# Patient Record
Sex: Female | Born: 1995 | Race: White | Hispanic: No | Marital: Single | Smoking: Never smoker
Health system: Southern US, Community
[De-identification: ages and names within clinical notes are randomized; demographics above are authoritative.]

---
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
## 2017-01-18 ENCOUNTER — Emergency Department (HOSPITAL_COMMUNITY)
Admission: EM | Admit: 2017-01-18 | Discharge: 2017-01-18 | Discharge status: Absent without leave | Payer: BLUE CROSS/BLUE SHIELD

## 2017-03-24 IMAGING — CR DG CHEST 2V
2 series · 2 of 2 positions shown · non-contrast
Comparison: None.

CLINICAL DATA: Status post fall

EXAM:
CHEST  2 VIEW

[w chest pa]
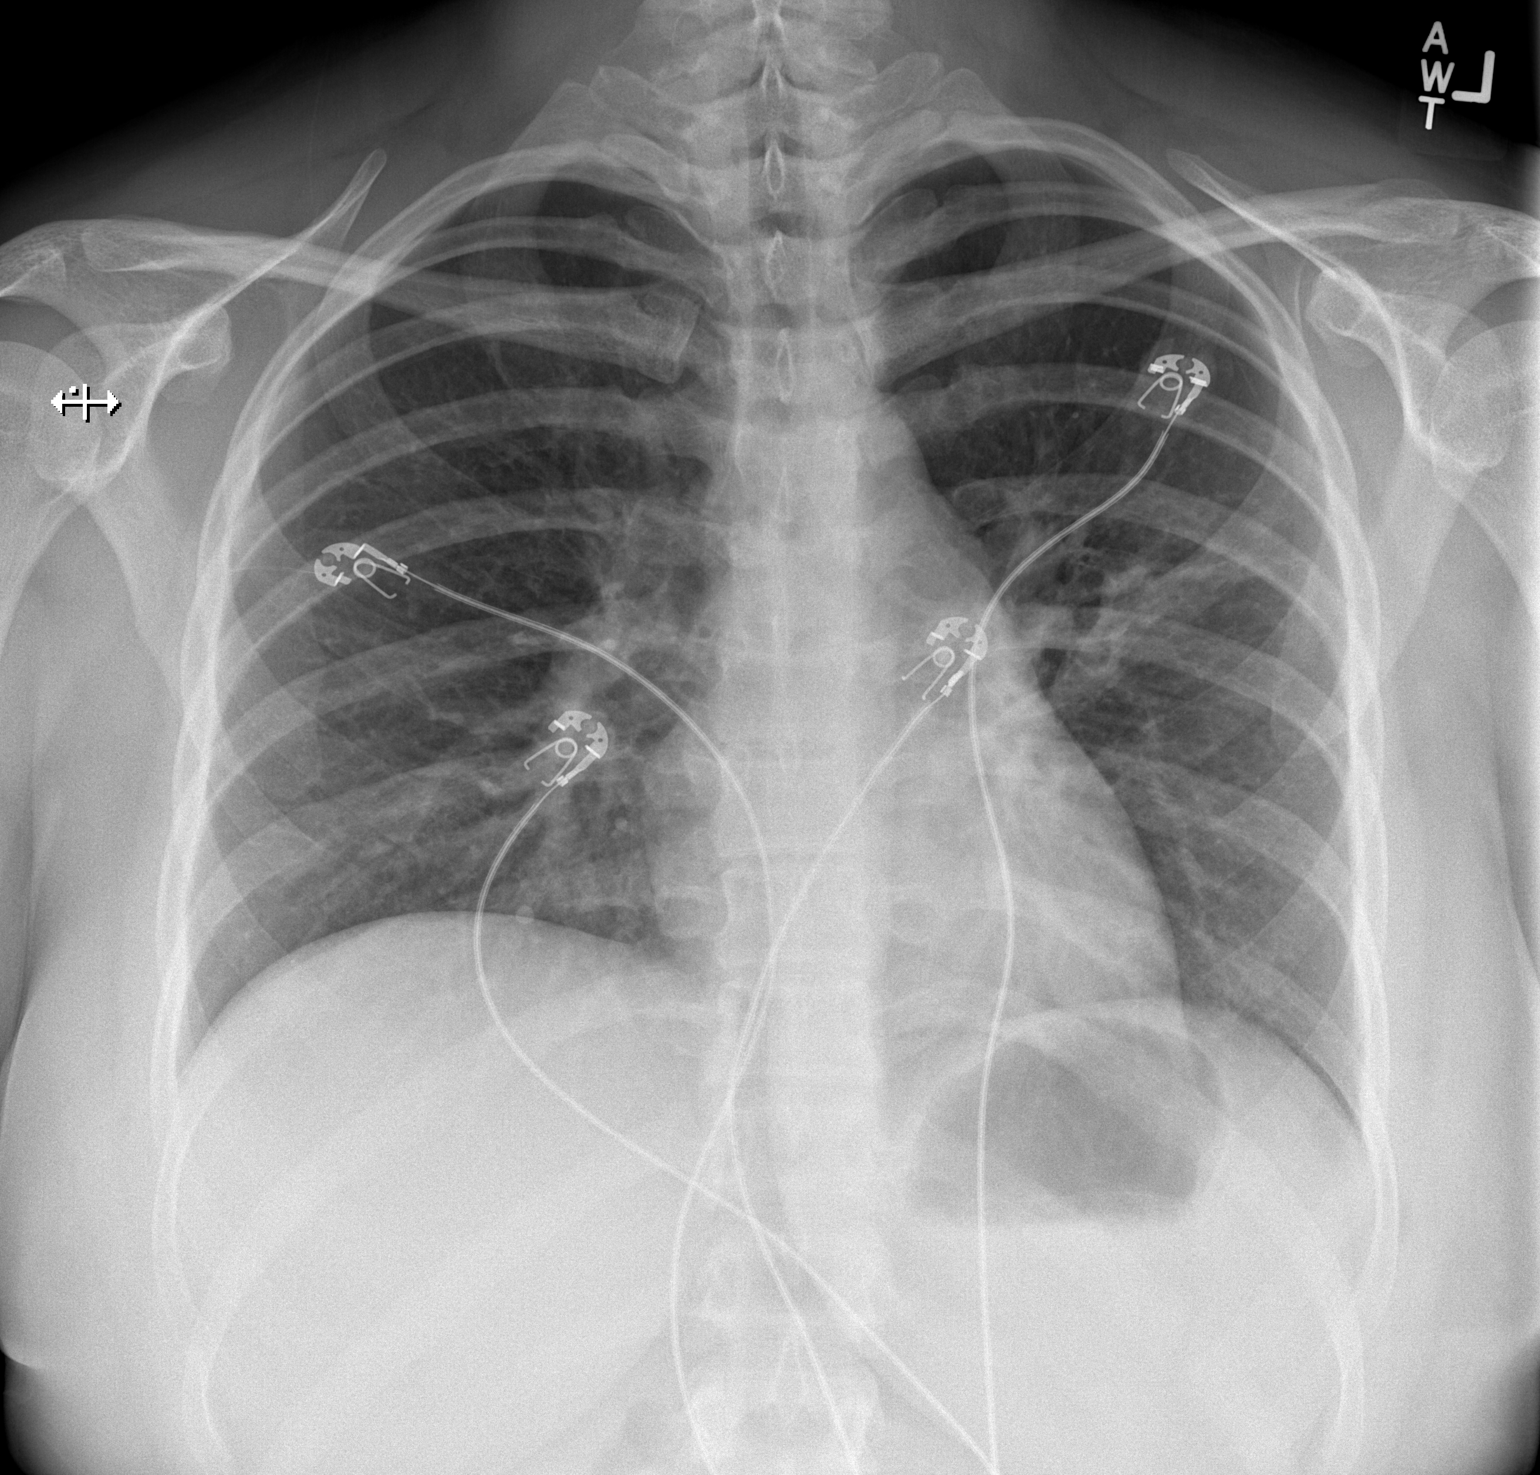

[w chest lat]
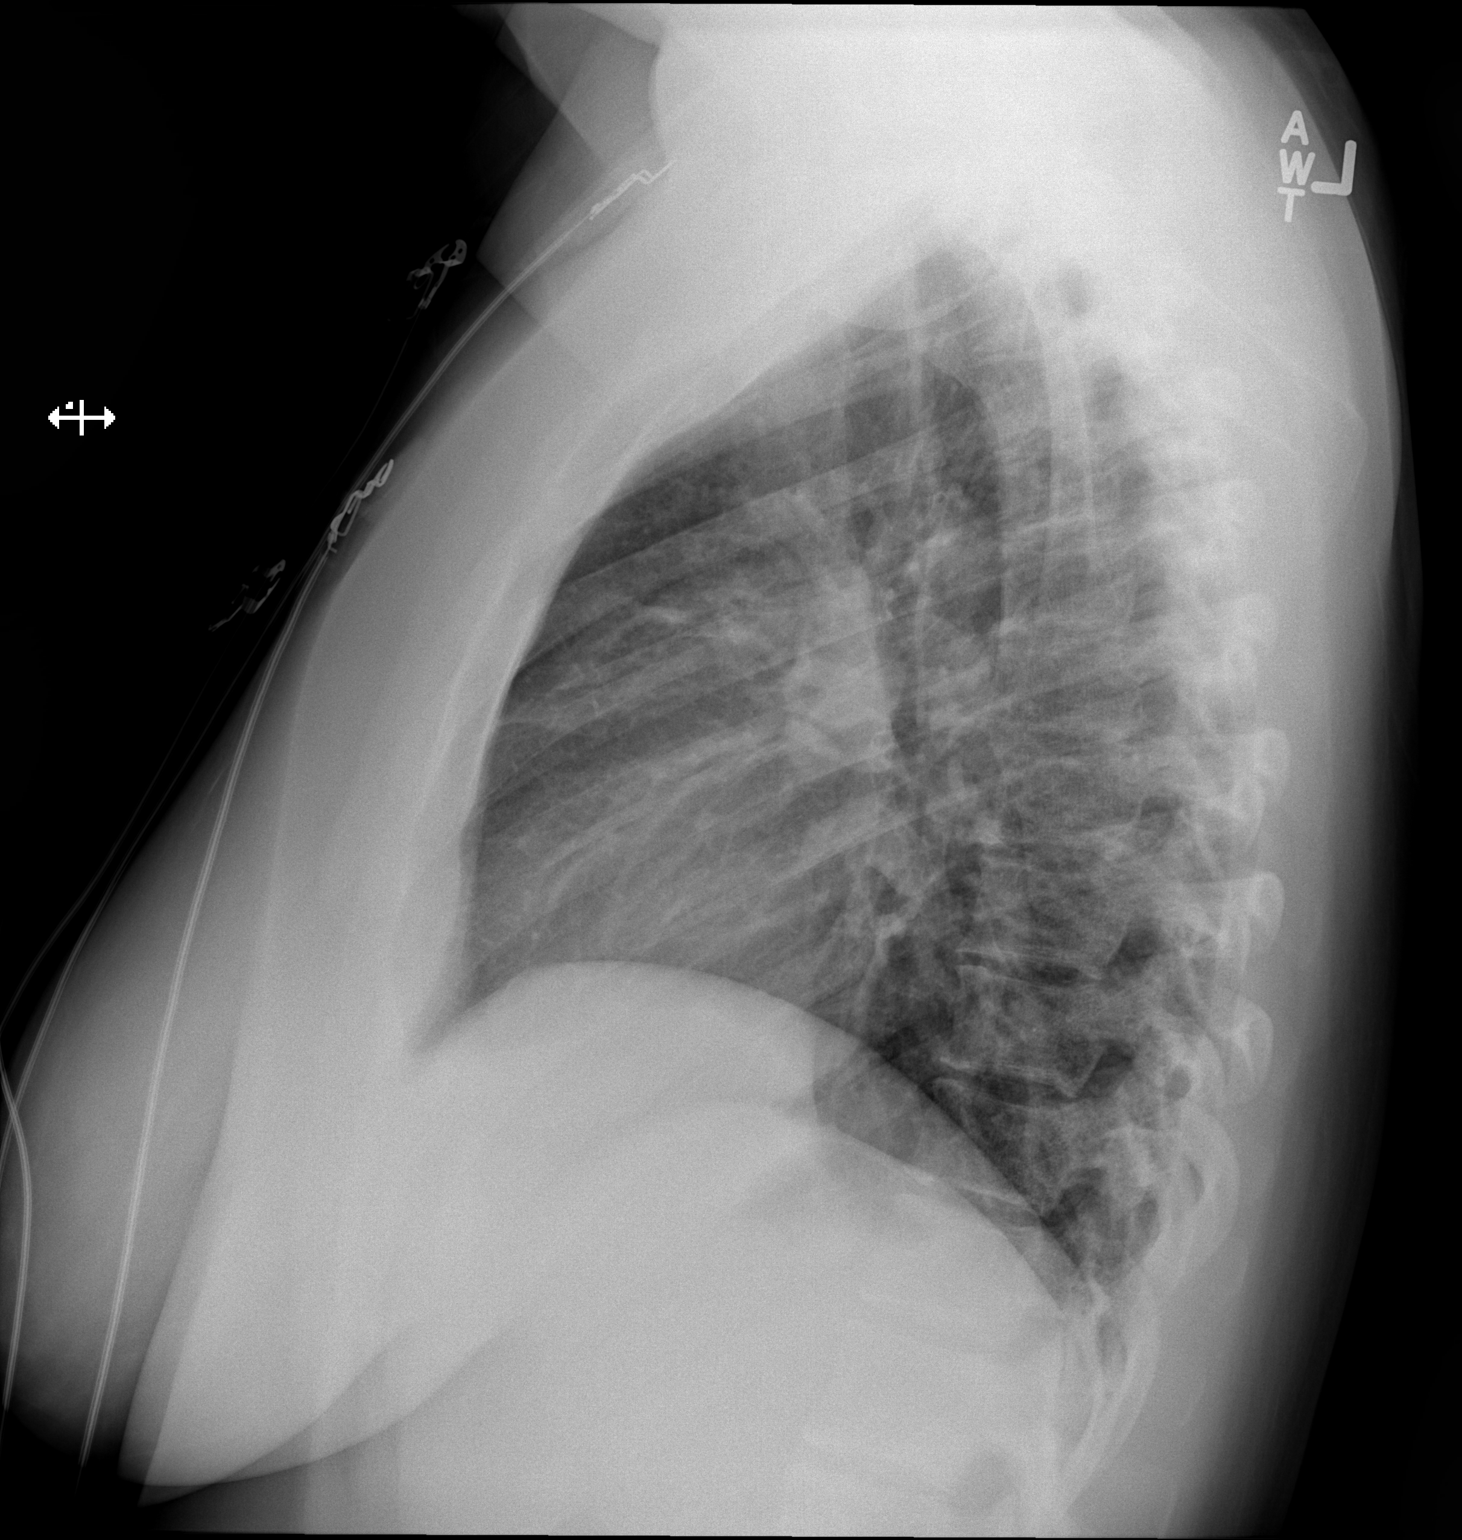

[2 of 2 positions shown; findings below may reference images not displayed]

FINDINGS: The heart size and mediastinal contours are within normal limits.
Both lungs are clear. The visualized skeletal structures are
unremarkable.
IMPRESSION: No active cardiopulmonary disease.

## 2017-03-24 IMAGING — CT CT MAXILLOFACIAL W/O CM
3 series · 15 of 47 positions shown, 18 images · non-contrast
Comparison: None.

CLINICAL DATA: Two syncopal episodes today. Pain around the left
thigh.

EXAM:
CT MAXILLOFACIAL WITHOUT CONTRAST
TECHNIQUE: Multidetector CT imaging of the maxillofacial structures was
performed. Multiplanar CT image reconstructions were also generated.
A small metallic BB was placed on the right temple in order to
reliably differentiate right from left.

[Series 2: facial st · axial · 0.35mm/px · z∈[+1296,+1422]mm · 9 of 73 slices shown, 12 images]
[im 5/73  brain]
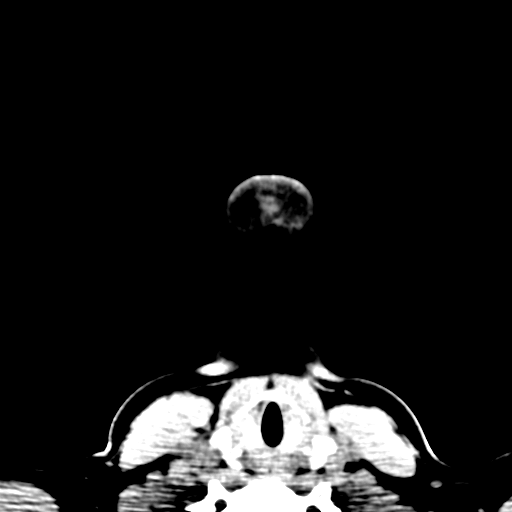
[im 5/73  bone]
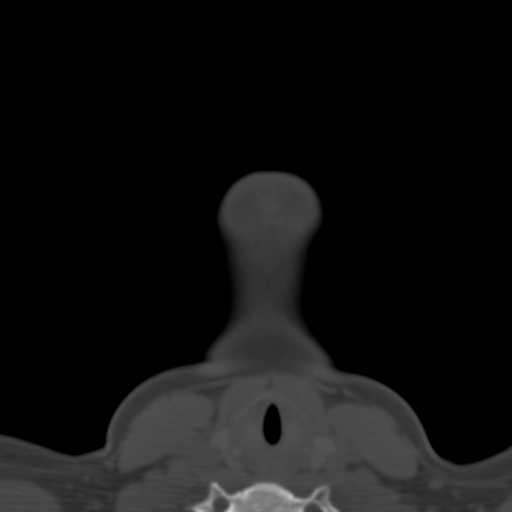
[im 13/73  bone]
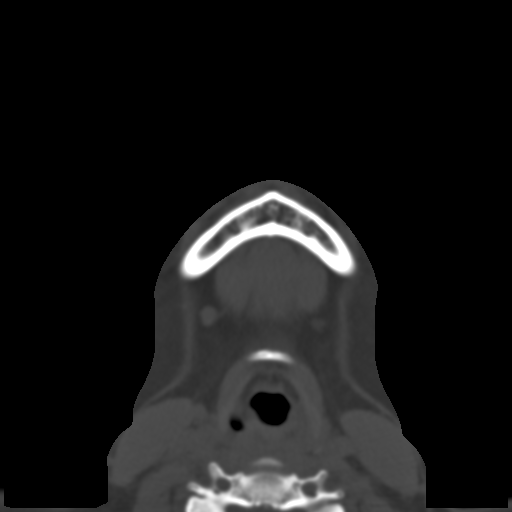
[im 20/73  bone]
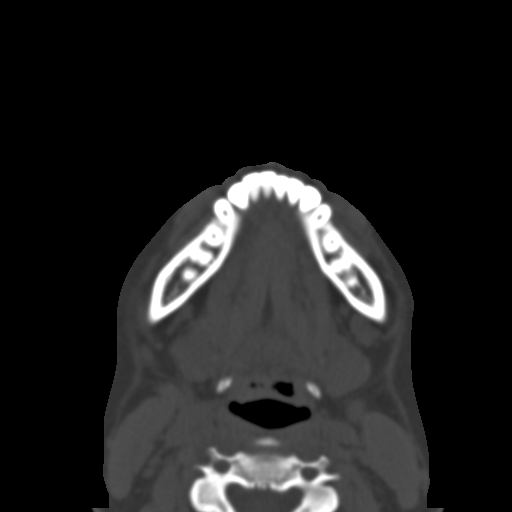
[im 28/73  bone]
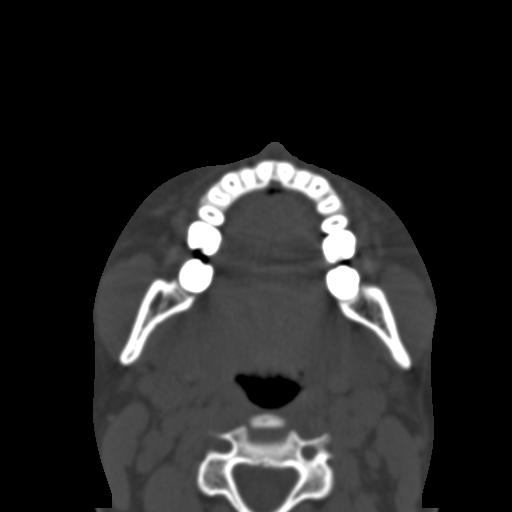
[im 38/73  brain]
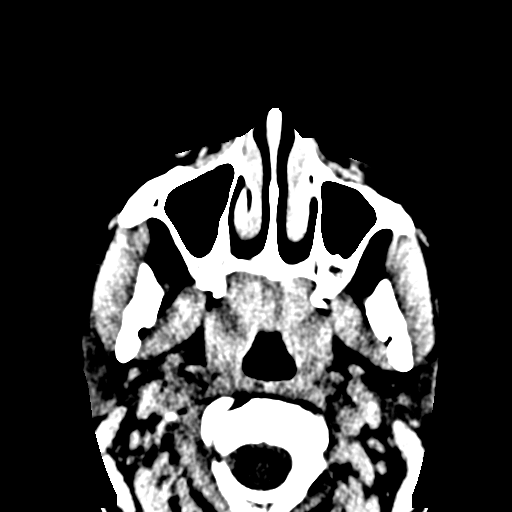
[im 38/73  bone]
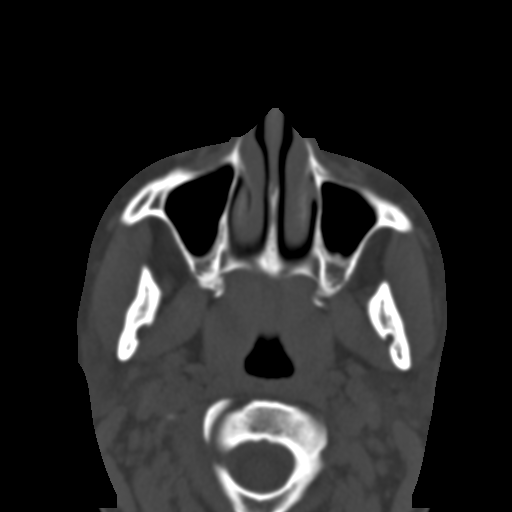
[im 45/73  bone]
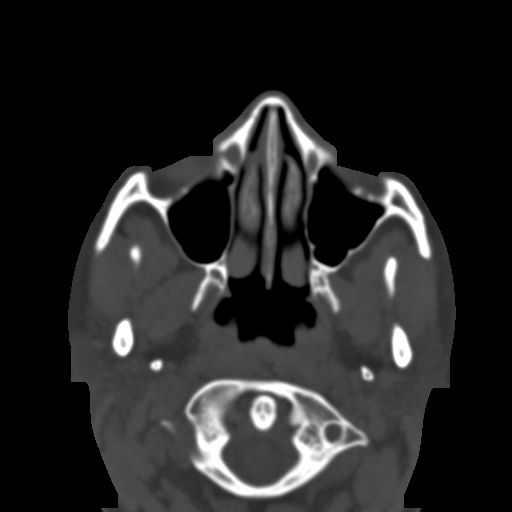
[im 53/73  bone]
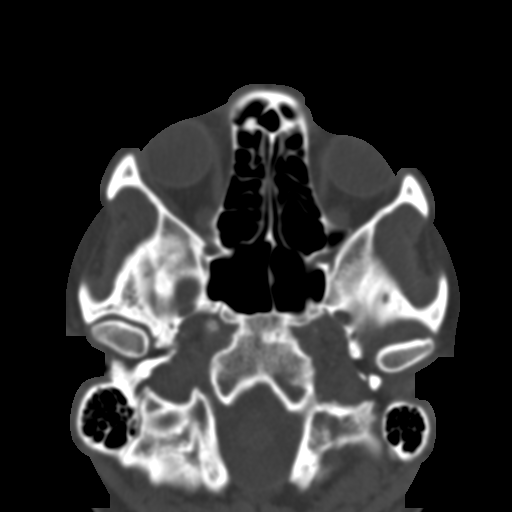
[im 60/73  bone]
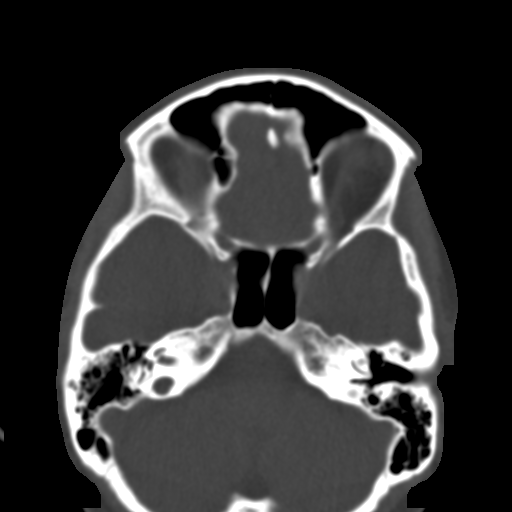
[im 68/73  brain]
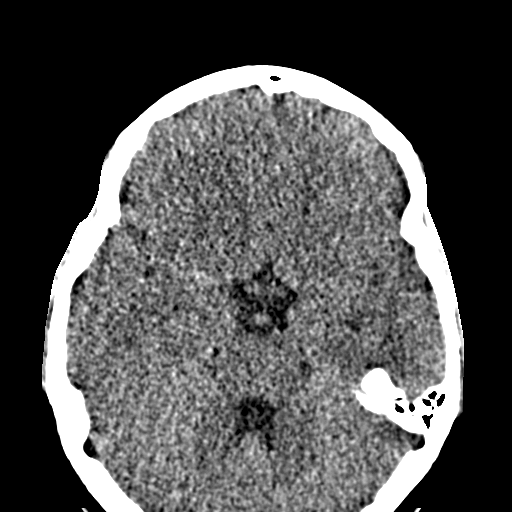
[im 68/73  bone]
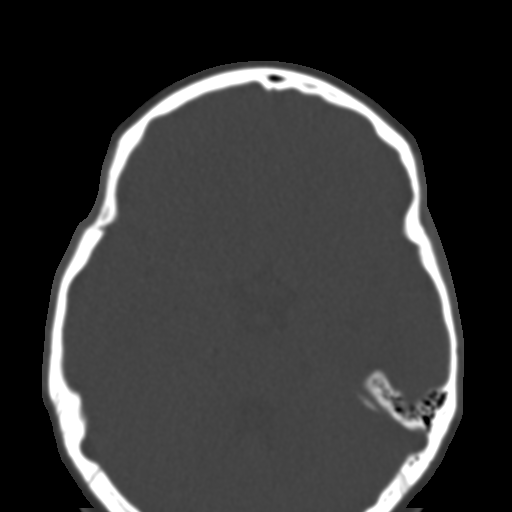

[Series 6: coronal st · coronal · 0.28mm/px · 3 of 60 slices shown]
[im 20/60  bone]
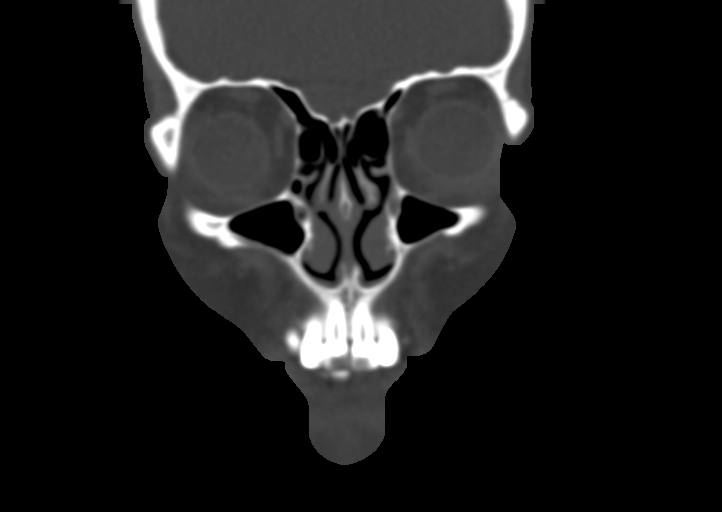
[im 27/60  bone]
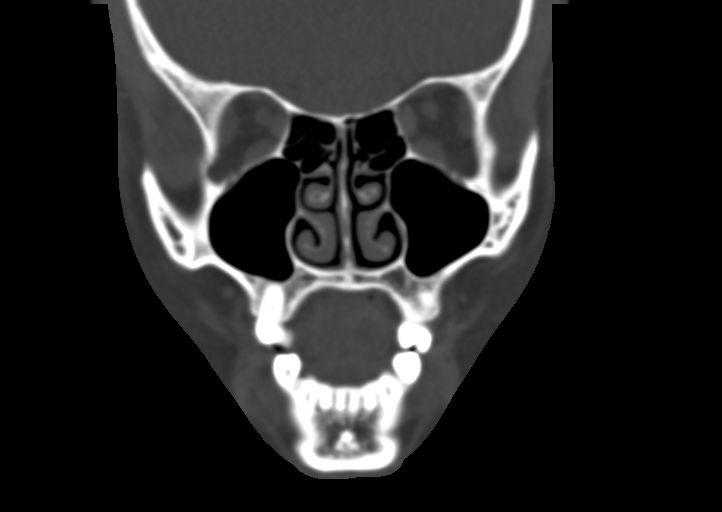
[im 33/60  bone]
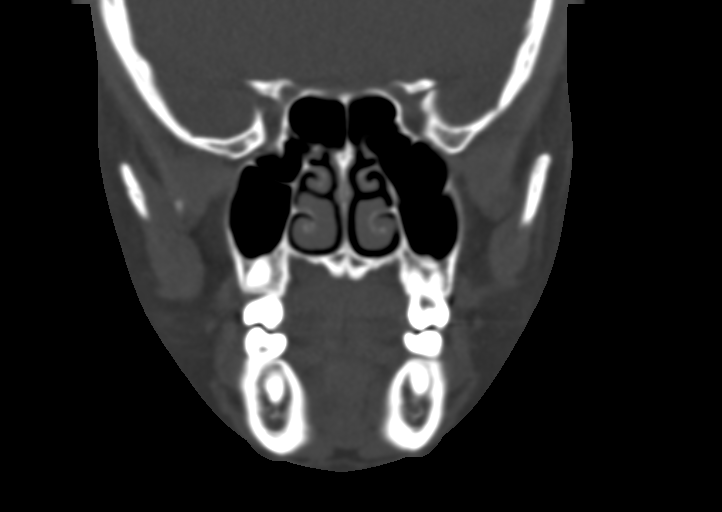

[Series 7: sagittal st · sagittal · 0.28mm/px · 3 of 82 slices shown]
[im 28/82  bone]
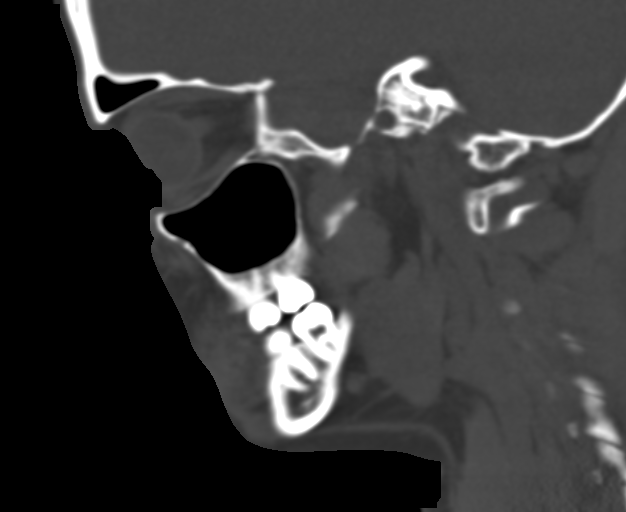
[im 41/82  bone]
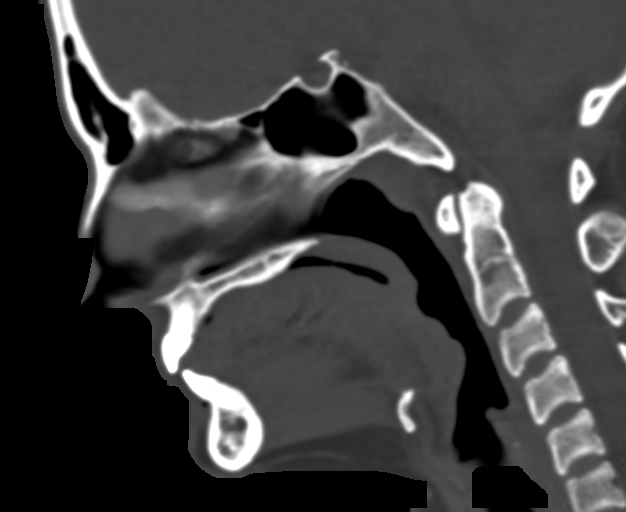
[im 55/82  bone]
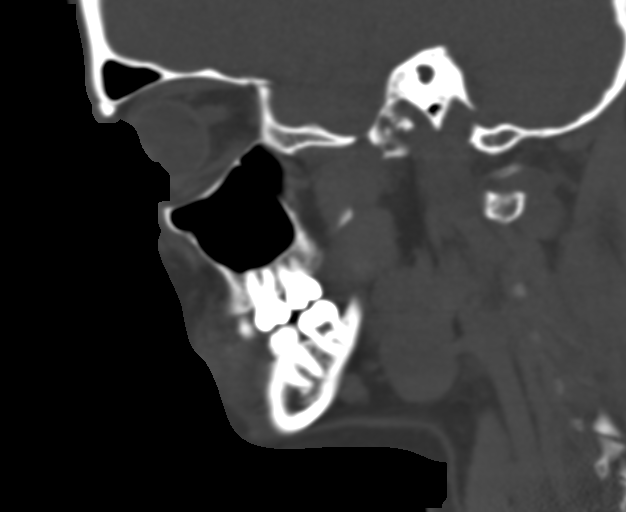

[15 of 47 positions shown; findings below may reference images not displayed]

FINDINGS: The globes are intact. The orbital walls are intact. The orbital
floors are intact. The maxilla is intact. The mandible is intact.
The zygomatic arches are intact. The nasal septum is midline. There
is no nasal bone fracture. The temporomandibular joints are normal.

The paranasal sinuses are clear. The visualized portions of the
mastoid sinuses are well aerated.
IMPRESSION: No acute osseous injury of the maxillofacial bones.

## 2017-03-27 ENCOUNTER — Other Ambulatory Visit (INDEPENDENT_AMBULATORY_CARE_PROVIDER_SITE_OTHER): Payer: BLUE CROSS/BLUE SHIELD

## 2017-03-27 ENCOUNTER — Ambulatory Visit (INDEPENDENT_AMBULATORY_CARE_PROVIDER_SITE_OTHER): Payer: BLUE CROSS/BLUE SHIELD | Admitting: Internal Medicine

## 2017-03-27 ENCOUNTER — Encounter: Payer: Self-pay | Admitting: Internal Medicine

## 2017-03-27 VITALS — BP 132/68 | HR 104 | Ht 65.0 in | Wt 242.6 lb

## 2017-03-27 DIAGNOSIS — J45991 Cough variant asthma: Secondary | ICD-10-CM

## 2017-03-27 LAB — CBC WITH DIFFERENTIAL/PLATELET
BASOS PCT: 0.7 % (ref 0.0–3.0)
Basophils Absolute: 0.1 10*3/uL (ref 0.0–0.1)
EOS PCT: 6.2 % — AB (ref 0.0–5.0)
Eosinophils Absolute: 0.6 10*3/uL (ref 0.0–0.7)
HCT: 40.6 % (ref 36.0–46.0)
HEMOGLOBIN: 13.8 g/dL (ref 12.0–15.0)
LYMPHS ABS: 3.2 10*3/uL (ref 0.7–4.0)
Lymphocytes Relative: 36.2 % (ref 12.0–46.0)
MCHC: 34.1 g/dL (ref 30.0–36.0)
MCV: 89.6 fl (ref 78.0–100.0)
MONOS PCT: 5.5 % (ref 3.0–12.0)
Monocytes Absolute: 0.5 10*3/uL (ref 0.1–1.0)
NEUTROS PCT: 51.4 % (ref 43.0–77.0)
Neutro Abs: 4.6 10*3/uL (ref 1.4–7.7)
Platelets: 276 10*3/uL (ref 150.0–400.0)
RBC: 4.54 Mil/uL (ref 3.87–5.11)
RDW: 13.4 % (ref 11.5–15.5)
WBC: 9 10*3/uL (ref 4.0–10.5)

## 2017-03-27 LAB — NITRIC OXIDE: Nitric Oxide: 116

## 2017-03-27 LAB — TSH: TSH: 3.62 u[IU]/mL (ref 0.35–4.50)

## 2017-03-27 MED ORDER — BUDESONIDE-FORMOTEROL FUMARATE 160-4.5 MCG/ACT IN AERO: 2.0000 | INHALATION_SPRAY | Freq: Two times a day (BID) | RESPIRATORY_TRACT | 0 refills | Status: DC

## 2017-03-27 MED ORDER — PANTOPRAZOLE SODIUM 40 MG PO TBEC: 40.0000 mg | DELAYED_RELEASE_TABLET | Freq: Every day | ORAL | 2 refills | Status: DC

## 2017-03-27 MED ORDER — BUDESONIDE-FORMOTEROL FUMARATE 160-4.5 MCG/ACT IN AERO: INHALATION_SPRAY | RESPIRATORY_TRACT | 11 refills | Status: AC

## 2017-03-27 MED ORDER — FAMOTIDINE 20 MG PO TABS: ORAL_TABLET | ORAL | 2 refills | Status: DC

## 2017-03-27 NOTE — Assessment & Plan Note (Signed)
Body mass index is 40.37 kg/m.  -  trending up No results found for: TSH   Contributing to gerd risk/ doe/reviewed the need and the process to achieve and maintain neg calorie balance > defer f/u primary care including intermittently monitoring thyroid status

## 2017-03-27 NOTE — Progress Notes (Signed)
Subjective:     Patient ID: Kelli Fox, female   DOB: Jun 03, 1995,    MRN: [id number]  HPI  39 yowf never smoker with onset of cough to point of vomiting acutely in 10/2014 > to ER 10/29/14 neg sinus CT/ cxr rx keflex> coughing ever since so self referred to pulmonary clinic 03/27/2017      03/27/2017 1st [facility] office visit/ Kelli Fox   Chief Complaint  Patient presents with  . Pulmonary Consult    Self referral. Pt c/o cough and SOB off and on since she had PNA in Fall of 2015. She is coughing up clear sputum. Cough is worse at night and sometimes waker her up. She has SOB when she is coughing or when she walks up stairs. She uses her albuterol inhaler 3 x per wk on average.   cough is "daily" since 10/2014 but more noticeable w/in 30 min of supine never uses saba at hs  Mucus is clear  Worse p ex / inhaler variably improves it Was on bcp's at onset  Treadmill x 20 min x 3.5 - 5 x 5 degrees  Prednisone eliminates all the symptoms for up to a week   No obvious other patterns in  day to day or daytime variability or assoc  purulent sputum or mucus plugs or hemoptysis or cp or chest tightness, subjective wheeze or overt sinus or hb symptoms. No unusual exposure hx or h/o childhood pna/ asthma or knowledge of premature birth.   . Also denies any obvious fluctuation of symptoms with weather or environmental changes or other aggravating or alleviating factors except as outlined above   Current Allergies, Complete Past Medical History, Past Surgical History, Family History, and Social History were reviewed in [employer] record.  ROS  The following are not active complaints unless bolded Hoarseness, sore throat, dysphagia, dental problems, itching, sneezing,  nasal congestion or discharge of excess mucus or purulent secretions, ear ache,   fever, chills, sweats, unintended wt loss or wt gain, classically pleuritic or exertional cp,  orthopnea pnd or leg swelling,  presyncope, palpitations, abdominal pain, anorexia, nausea, vomiting, diarrhea  or change in bowel habits or change in bladder habits, change in stools or change in urine, dysuria, hematuria,  rash, arthralgias, visual complaints, headache, numbness, weakness or ataxia or problems with walking or coordination,  change in mood/affect or memory.         Current Meds  Medication Sig  . albuterol (PROAIR HFA) 108 (90 Base) MCG/ACT inhaler Inhale 2 puffs into the lungs every 6 (six) hours as needed for wheezing or shortness of breath.  . Drospirenone-Ethinyl Estradiol (LORYNA PO) Take 1 tablet by mouth daily.         Review of Systems     Objective:   Physical Exam    obese pleasant amb wf nad   Wt Readings from Last 3 Encounters:  03/27/17 242 lb 9.6 oz (110 kg)  10/29/14 196 lb (88.9 kg) (97 %, Z= 1.89)*   * Growth percentiles are based on CDC (Girls, 2-20 Years) data.     Vital signs reviewed - Note on arrival 02 sats  95% on RA  HEENT: nl dentition, turbinates bilaterally, and oropharynx. Nl external ear canals without cough reflex   NECK :  without JVD/Nodes/TM/ nl carotid upstrokes bilaterally   LUNGS: no acc muscle use,  Nl contour chest with insp and exp rhonchi bilaterally   CV:  RRR  no s3 or murmur or increase  in P2, and no edema   ABD:  soft and nontender with nl inspiratory excursion in the supine position. No bruits or organomegaly appreciated, bowel sounds nl  MS:  Nl gait/ ext warm without deformities, calf tenderness, cyanosis or clubbing No obvious joint restrictions   SKIN: warm and dry without lesions    NEURO:  alert, approp, nl sensorium with  no motor or cerebellar deficits apparent.       I personally reviewed images and agree with radiology impression as follows:  CXR:   01/19/17 wnl   Allergy profile 03/27/2017 >  Eos 0. /  IgE     Assessment:

## 2017-03-27 NOTE — Assessment & Plan Note (Signed)
FENO 03/27/2017  =   116 - Spirometry 03/27/2017  FEV1 2.24 (66%)  Ratio 79 with mild curvature   - 03/27/2017  After extensive coaching inhaler device  effectiveness =    90% > try symb 160 2bid and max gerd rx   DDX of  difficult airways management almost all start with A and  include Adherence, Ace Inhibitors, Acid Reflux, Active Sinus Disease, Alpha 1 Antitripsin deficiency, Anxiety masquerading as Airways dz,  ABPA,  Allergy(esp in young), Aspiration (esp in elderly), Adverse effects of meds,  Active smokers, A bunch of PE's (a small clot burden can't cause this syndrome unless there is already severe underlying pulm or vascular dz with poor reserve) plus two Bs  = Bronchiectasis and Beta blocker use..and one C= CHF   Adherence is always the initial "prime suspect" and is a multilayered concern that requires a "trust but verify" approach in every patient - starting with knowing how to use medications, especially inhalers, correctly, keeping up with refills and understanding the fundamental difference between maintenance and prns vs those medications only taken for a very short course and then stopped and not refilled.  - see hfa teaching  ? Acid (or non-acid) GERD > always difficult to exclude as up to 75% of pts in some series report no assoc GI/ Heartburn symptoms and she is on bcp's with h/o cough to vomiting > rec max (24h)  acid suppression and diet restrictions/ reviewed and instructions given in writing.   ? Allergy  > suggested by response to pred and high FENO >  High dose ICS/ allergy testing  ? ABPA > check IgE  ? Active Sinus Dz  > note baseline sinus ct neg and only mild rhinitis symptoms  ? Bronchiectasis > never really had pna at onset  And cxr is nl now so this is less likely even though hx = "cough ever since pna"  Discussed in detail all the  indications, usual  risks and alternatives  relative to the benefits with patient who agrees to proceed with w/u as outlined.       Total time devoted to counseling  > 50 % of initial 60 min office visit:  review case with pt/ discussion of options/alternatives/ personally creating written customized instructions  in presence of pt  then going over those specific  Instructions directly with the pt including how to use all of the meds but in particular covering each new medication in detail and the difference between the maintenance= "automatic" meds and the prns using an action plan format for the latter (If this problem/symptom => do that organization reading Left to right).  Please see AVS from this visit for a full list of these instructions which I personally wrote for this pt and  are unique to this visit.

## 2017-03-27 NOTE — Patient Instructions (Addendum)
Plan A = Automatic = symbicort 160 Take 2 puffs first thing in am and then another 2 puffs about 12 hours later.  And Pantoprazole (protonix) 40 mg   Take  30-60 min before first meal of the day and Pepcid (famotidine)  20 mg one @  bedtime until return to office - this is the best way to tell whether stomach acid is contributing to your problem.    Plan B = Backup Only use your albuterol as a rescue medication to be used if you can't catch your breath by resting or doing a relaxed purse lip breathing pattern.  - The less you use it, the better it will work when you need it. - Ok to use the inhaler up to 2 puffs  every 4 hours if you must but call for appointment if use goes up over your usual need - Don't leave home without it !!  (think of it like the spare tire for your car)      GERD (REFLUX)  is an extremely common cause of respiratory symptoms just like yours , many times with no obvious heartburn at all.    It can be treated with medication, but also with lifestyle changes including elevation of the head of your bed (ideally with 6 inch  bed blocks),  Smoking cessation, avoidance of late meals, excessive alcohol, and avoid fatty foods, chocolate, peppermint, colas, red wine, and acidic juices such as orange juice.  NO MINT OR MENTHOL PRODUCTS SO NO COUGH DROPS   USE SUGARLESS CANDY INSTEAD (Jolley ranchers or Stover's or Life Savers) or even ice chips will also do - the key is to swallow to prevent all throat clearing. NO OIL BASED VITAMINS - use powdered substitutes.    Please remember to go to the lab department downstairs in the basement  for your tests - we will call you with the results when they are available.      Please schedule a follow up office visit in 4 weeks, sooner if needed  Add check tsh if not done

## 2017-03-28 LAB — RESPIRATORY ALLERGY PROFILE REGION II ~~LOC~~
Allergen, A. alternata, m6: <0.1 kU/L
Allergen, Comm Silver Birch, t9: <0.1 kU/L
Allergen, Cottonwood, t14: <0.1 kU/L
Allergen, Mulberry, t76: <0.1 kU/L
Bermuda Grass: <0.1 kU/L
Box Elder IgE: <0.1 kU/L
CLADOSPORIUM HERBARUM (M2) IGE: <0.1 kU/L
CLASS: 0
CLASS: 0
CLASS: 0
CLASS: 0
CLASS: 0
CLASS: 0
CLASS: 0
CLASS: 0
CLASS: 0
CLASS: 0
COMMON RAGWEED (SHORT) (W1) IGE: <0.1 kU/L
Cat Dander: <0.1 kU/L
Class: 0
Class: 0
Class: 0
Class: 0
Class: 0
Class: 0
Class: 0
Class: 0
Class: 0
Class: 0
Class: 0
Class: 0
Class: 0
Class: 0
Cockroach: <0.1 kU/L
Dog Dander: <0.1 kU/L
Elm IgE: <0.1 kU/L
IGE (IMMUNOGLOBULIN E), SERUM: 38 kU/L (ref <=114)
Johnson Grass: <0.1 kU/L
Pecan/Hickory Tree IgE: <0.1 kU/L
Sheep Sorrel IgE: <0.1 kU/L

## 2017-03-28 LAB — INTERPRETATION:

## 2017-03-29 NOTE — Progress Notes (Signed)
Spoke with pt and notified of results per Dr. Wert. Pt verbalized understanding and denied any questions. 

## 2017-04-24 ENCOUNTER — Encounter: Payer: Self-pay | Admitting: Internal Medicine

## 2017-04-24 ENCOUNTER — Other Ambulatory Visit (INDEPENDENT_AMBULATORY_CARE_PROVIDER_SITE_OTHER): Payer: BLUE CROSS/BLUE SHIELD

## 2017-04-24 ENCOUNTER — Ambulatory Visit (INDEPENDENT_AMBULATORY_CARE_PROVIDER_SITE_OTHER): Payer: BLUE CROSS/BLUE SHIELD | Admitting: Internal Medicine

## 2017-04-24 VITALS — BP 120/78 | HR 95 | Ht 65.0 in | Wt 249.0 lb

## 2017-04-24 DIAGNOSIS — J310 Chronic rhinitis: Secondary | ICD-10-CM | POA: Diagnosis not present

## 2017-04-24 DIAGNOSIS — K219 Gastro-esophageal reflux disease without esophagitis: Secondary | ICD-10-CM | POA: Diagnosis not present

## 2017-04-24 DIAGNOSIS — J45991 Cough variant asthma: Secondary | ICD-10-CM

## 2017-04-24 LAB — TSH: TSH: 2.02 u[IU]/mL (ref 0.35–4.50)

## 2017-04-24 NOTE — Progress Notes (Signed)
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 Pulmonary Consult    Self referral. Pt c/o cough and SOB off and on since she had PNA in Fall of 2015. She is coughing up clear sputum. Cough is worse at night and sometimes waker her up. She has SOB when she is coughing or when she walks up stairs. She uses her albuterol inhaler 3 x per wk on average.   cough is "daily" since 10/2014 but more noticeable w/in 30 min of supine never uses saba at hs  Mucus is clear  Worse p ex / inhaler variably improves it Was on bcp's at onset  Treadmill x 20 min x 3.5 - 5 x 5 degrees  X 3-4 times per week Prednisone eliminates all the symptoms for up to a week  rec Plan A = Automatic = symbicort 160 Take 2 puffs first thing in am and then another 2 puffs about 12 hours later.  And Pantoprazole (protonix) 40 mg   Take  30-60 min before first meal of the day and Pepcid (famotidine)  20 mg one @  bedtime until return to office - this is the best way to tell whether stomach acid is contributing to your problem.   Plan B = Backup Only use your albuterol as a rescue medication to be used if you can't catch your breath by resting or doing a relaxed purse lip breathing pattern.  - The less you use it, the better it will work when you need it. - Ok to use the inhaler up to 2 puffs  every 4 hours if you must but call for appointment if use goes up over your usual need - Don't leave home without it !!  (think of it like the spare tire for your car)  GERD diet   Add check tsh if not done    04/24/2017  f/u ov/[facility]ina Howlett re: asthma/  rhinitis Chief Complaint  Patient presents with  . Follow-up    Cough and SOB are much improved. She is clearing her throat some this morning. She rarely uses her albuterol inhaler.    Dyspnea:  [place] problem  Cough: much better Sleep: ok SABA use:  Min Nasal congestion better with clariton  Cramping after ppi    No obvious day to day or daytime variability or assoc excess/ purulent sputum or mucus plugs or hemoptysis or cp or chest tightness, subjective wheeze or overt sinus or hb symptoms. No unusual exposure hx or h/o childhood pna/ asthma or knowledge of premature birth.  Sleeping  Fine flat  without nocturnal  or early am exacerbation  of respiratory  c/o's or need for noct saba. Also denies any obvious fluctuation of symptoms with weather or environmental changes or other aggravating or alleviating factors except as outlined above   Current Allergies, Complete Past Medical History, Past Surgical History, Family History, and Social History were reviewed in [facility] electronic medical record.  ROS  The following are not active complaints unless bolded Hoarseness, sore  throat, dysphagia, dental problems, itching, sneezing,  nasal congestion or discharge of excess mucus or purulent secretions, ear ache,   fever, chills, sweats, unintended wt loss or wt gain, classically pleuritic or exertional cp,  orthopnea pnd or arm/hand swelling  or leg swelling, presyncope, palpitations, abdominal pain= cramping only when takes ppi, anorexia, nausea, vomiting, diarrhea  or change in bowel habits or change in bladder habits, change in stools or change in urine, dysuria, hematuria,  rash, arthralgias, visual complaints, headache, numbness, weakness or ataxia or problems with walking or coordination,  change in mood or  memory.        Current Meds  Medication Sig  . albuterol (PROAIR HFA) 108 (90 Base) MCG/ACT inhaler Inhale 2 puffs into the lungs every 6 (six) hours as needed  for wheezing or shortness of breath.  . budesonide-formoterol (SYMBICORT) 160-4.5 MCG/ACT inhaler Take 2 puffs first thing in am and then another 2 puffs about 12 hours later.  . Drospirenone-Ethinyl Estradiol (LORYNA PO) Take 1 tablet by mouth daily.  . famotidine (PEPCID) 20 MG tablet One at bedtime  .   pantoprazole (PROTONIX) 40 MG tablet Take 1 tablet (40 mg total) by mouth daily. Take 30-60 min before first meal of the day                  Objective:   Physical Exam  Pleasant wf nad / nasal tone to voice   Wt Readings from Last 3 Encounters:  04/24/17 249 lb (112.9 kg)  03/27/17 242 lb 9.6 oz (110 kg)  10/29/14 196 lb (88.9 kg) (97 %, Z= 1.89)*   * Growth percentiles are based on CDC (Girls, 2-20 Years) data.     Vital signs reviewed - Note on arrival 02 sats  99% on RA    HEENT: nl dentition and oropharynx. Nl external ear canals without cough reflex - mild bilateral non-specific turbinate edema watery secretions only     NECK :  without JVD/Nodes/TM/ nl carotid upstrokes bilaterally   LUNGS: no acc muscle use,  Nl contour chest which is clear to A and P bilaterally without cough on insp or exp maneuvers   CV:  RRR  no s3 or murmur or increase in P2, and no edema   ABD:  Obese/ soft and nontender with nl inspiratory excursion in the supine position. No bruits or organomegaly appreciated, bowel sounds nl  MS:  Nl gait/ ext warm without deformities, calf tenderness, cyanosis or clubbing No obvious joint restrictions   SKIN: warm and dry without lesions    NEURO:  alert, approp, nl sensorium with  no motor or cerebellar deficits apparent.            Labs ordered 04/24/2017  TSH        Assessment:

## 2017-04-24 NOTE — Patient Instructions (Addendum)
Stop protonix and take pepcid 20 mg after bfast and after supper x one week then just after breakfast for a week and then stop  - if you start coughing off of protonix then we can call you Aciphex   Ok to use clariton or zyrtec for you nasal symptoms and if worse add Nasacort AQ over the counter   Please remember to go to the lab department downstairs in the basement  for your tests - we will call you with the results when they are available.      Please schedule a follow up visit in 3 months but call sooner if needed

## 2017-04-24 NOTE — Progress Notes (Signed)
Left detailed msg with results

## 2017-04-25 ENCOUNTER — Encounter: Payer: Self-pay | Admitting: Internal Medicine

## 2017-04-25 DIAGNOSIS — K219 Gastro-esophageal reflux disease without esophagitis: Secondary | ICD-10-CM | POA: Insufficient documentation

## 2017-04-25 DIAGNOSIS — J310 Chronic rhinitis: Secondary | ICD-10-CM | POA: Insufficient documentation

## 2017-04-25 NOTE — Assessment & Plan Note (Signed)
I recommended a trial of over-the-counter's first but if not improved on this we can certainly add Singulair on a trial basis for a month to see if it helps. Based on her allergy profile there is no role for allergy workup here.

## 2017-04-25 NOTE — Assessment & Plan Note (Signed)
FENO 03/27/2017  =   116 - Spirometry 03/27/2017  FEV1 2.24 (66%)  Ratio 79 with mild curvature   - 03/27/2017  After extensive coaching inhaler device  effectiveness =    90% > try symb 160 2bid and max gerd rx  - Allergy profile  03/27/17  >  Eos 0.6 /  IgE   38   RAST neg   All goals of chronic asthma control met including optimal function and elimination of symptoms with minimal need for rescue therapy.  Contingencies discussed in full including contacting this office immediately if not controlling the symptoms using the rule of two's.     Could add sinuglar next for rhintis (see separate a/p)

## 2017-04-25 NOTE — Assessment & Plan Note (Signed)
Clinical diagnosis only. Note she is tolerated PPIs poorly.  Discussed the recent press about ppi's in the context of a statistically significant (but questionably clinically relevant) increase in CRI in pts on ppi vs h2's > bottom line is the lowest dose of ppi that controls gerd symbtoms  is the right dose and if that dose is zero that's fine esp since h2's are cheaper and can also be tapered off if she does not have obvious reflux or cough symptoms as she weans the h2's  see avs for instructions unique to this ov

## 2017-04-25 NOTE — Assessment & Plan Note (Signed)
Body mass index is 41.44 kg/m.  -  trending up Lab Results  Component Value Date   TSH 2.02 04/24/2017     Contributing to gerd risk/ doe/reviewed the need and the process to achieve and maintain neg calorie balance > defer f/u primary care including intermittently monitoring thyroid status

## 2017-05-11 ENCOUNTER — Telehealth: Payer: Self-pay | Admitting: Internal Medicine

## 2017-05-11 MED ORDER — RABEPRAZOLE SODIUM 20 MG PO TBEC: 20.0000 mg | DELAYED_RELEASE_TABLET | Freq: Every day | ORAL | 5 refills | Status: DC

## 2017-05-11 NOTE — Telephone Encounter (Signed)
Pt called back, states that she has stopped protonix and completely tapered off prednisone as MW recommended, and is now having worsening reflux with any food/drink, including water.  Pt is requesting additional recs.    Pt uses [facility] on [address]   MW please advise.  Thanks!   4/9 AVS: Instructions   Stop protonix and take pepcid 20 mg after bfast and after supper x one week then just after breakfast for a week and then stop  - if you start coughing off of protonix then we can call you Aciphex    Ok to use clariton or zyrtec for you nasal symptoms and if worse add Nasacort AQ over the counter    Please remember to go to the lab department downstairs in the basement  for your tests - we will call you with the results when they are available.       Please schedule a follow up visit in 3 months but call sooner if needed

## 2017-05-11 NOTE — Telephone Encounter (Signed)
lmtcb for pt.  

## 2017-05-11 NOTE — Telephone Encounter (Signed)
Attempted to call patient, no answer, message left to call back.  

## 2017-05-11 NOTE — Telephone Encounter (Signed)
If already tried pepcid 20 mg bid after meals can also try gaviscon liquid otc prn but if this is not adequate Aciphex 20 mg Take 30-60 min before first meal of the day but may be more expensive

## 2017-05-11 NOTE — Telephone Encounter (Signed)
Pt is returning call. Cb is [phone]

## 2017-05-11 NOTE — Telephone Encounter (Signed)
Pt aware of results/recs.  Rx sent to preferred pharmacy.  Nothing further needed.

## 2017-06-18 ENCOUNTER — Other Ambulatory Visit: Payer: Self-pay | Admitting: Internal Medicine

## 2017-06-18 DIAGNOSIS — J45991 Cough variant asthma: Secondary | ICD-10-CM

## 2017-07-24 ENCOUNTER — Ambulatory Visit: Payer: BLUE CROSS/BLUE SHIELD | Admitting: Internal Medicine

## 2017-11-14 ENCOUNTER — Encounter (INDEPENDENT_AMBULATORY_CARE_PROVIDER_SITE_OTHER): Payer: Self-pay

## 2017-11-15 ENCOUNTER — Encounter (INDEPENDENT_AMBULATORY_CARE_PROVIDER_SITE_OTHER): Payer: Self-pay

## 2017-11-27 ENCOUNTER — Ambulatory Visit (INDEPENDENT_AMBULATORY_CARE_PROVIDER_SITE_OTHER): Payer: Self-pay | Admitting: Family Medicine

## 2017-12-12 ENCOUNTER — Ambulatory Visit (INDEPENDENT_AMBULATORY_CARE_PROVIDER_SITE_OTHER): Payer: Self-pay | Admitting: Family Medicine

## 2018-05-22 ENCOUNTER — Other Ambulatory Visit: Payer: Self-pay | Admitting: Internal Medicine

## 2018-11-19 ENCOUNTER — Other Ambulatory Visit: Payer: Self-pay | Admitting: Internal Medicine

## 2018-12-09 ENCOUNTER — Other Ambulatory Visit: Payer: Self-pay | Admitting: Internal Medicine

## 2019-04-08 ENCOUNTER — Ambulatory Visit: Payer: BLUE CROSS/BLUE SHIELD | Admitting: Internal Medicine

## 2019-04-21 ENCOUNTER — Ambulatory Visit: Payer: BLUE CROSS/BLUE SHIELD | Admitting: Internal Medicine
